# Patient Record
Sex: Female | Born: 1955 | Race: White | Hispanic: No | Marital: Married
Health system: Midwestern US, Community
[De-identification: ages and names within clinical notes are randomized; demographics above are authoritative.]

## PROBLEM LIST (undated history)

## (undated) DIAGNOSIS — S069XAA Unspecified intracranial injury with loss of consciousness status unknown, initial encounter: Secondary | ICD-10-CM

## (undated) DIAGNOSIS — F419 Anxiety disorder, unspecified: Secondary | ICD-10-CM

## (undated) HISTORY — PX: BACK SURGERY: SHX140

---

## 2016-03-24 ENCOUNTER — Emergency Department (HOSPITAL_COMMUNITY): Payer: 59

## 2016-03-24 ENCOUNTER — Emergency Department (HOSPITAL_COMMUNITY)
Admission: EM | Admit: 2016-03-24 | Discharge: 2016-03-24 | Disposition: A | Discharge status: Home / Self Care | Payer: 59 | Attending: Emergency Medicine | Admitting: Emergency Medicine

## 2016-03-24 ENCOUNTER — Encounter (HOSPITAL_COMMUNITY): Payer: Self-pay | Admitting: Emergency Medicine

## 2016-03-24 DIAGNOSIS — M79632 Pain in left forearm: Secondary | ICD-10-CM | POA: Diagnosis present

## 2016-03-24 DIAGNOSIS — S0990XA Unspecified injury of head, initial encounter: Secondary | ICD-10-CM | POA: Diagnosis not present

## 2016-03-24 DIAGNOSIS — T148XXA Other injury of unspecified body region, initial encounter: Secondary | ICD-10-CM

## 2016-03-24 DIAGNOSIS — Y929 Unspecified place or not applicable: Secondary | ICD-10-CM | POA: Diagnosis not present

## 2016-03-24 DIAGNOSIS — Y939 Activity, unspecified: Secondary | ICD-10-CM | POA: Diagnosis not present

## 2016-03-24 DIAGNOSIS — S93402A Sprain of unspecified ligament of left ankle, initial encounter: Secondary | ICD-10-CM | POA: Diagnosis not present

## 2016-03-24 DIAGNOSIS — Y999 Unspecified external cause status: Secondary | ICD-10-CM | POA: Diagnosis not present

## 2016-03-24 HISTORY — DX: Anxiety disorder, unspecified: F41.9

## 2016-03-24 MED ORDER — IBUPROFEN 200 MG PO TABS
600.0000 mg | ORAL_TABLET | Freq: Once | ORAL | Status: AC
  Administered 2016-03-24: 600 mg via ORAL
  Filled 2016-03-24: qty 3

## 2016-03-24 MED ORDER — IBUPROFEN 600 MG PO TABS: 600.0000 mg | ORAL_TABLET | Freq: Four times a day (QID) | ORAL | 0 refills | Status: AC | PRN

## 2016-03-24 MED ORDER — TRAMADOL HCL 50 MG PO TABS: 100.0000 mg | ORAL_TABLET | Freq: Four times a day (QID) | ORAL | 0 refills | Status: AC | PRN
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
## 2016-03-24 NOTE — ED Notes (Signed)
No respiratory or acute distress noted alert and oriented x 3 ice packs given warm blanket given call light in reach.

## 2016-03-24 NOTE — ED Notes (Signed)
No respiratory or acute distress noted alert and oriented x 3 wheelchair left with family no reaction to medication noted crutches with pt.
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
## 2018-05-05 ENCOUNTER — Encounter

## 2018-05-15 ENCOUNTER — Inpatient Hospital Stay: Admit: 2018-05-15 | Payer: PRIVATE HEALTH INSURANCE | Attending: Psychiatry | Primary: Family Medicine

## 2018-05-15 DIAGNOSIS — S069X9A Unspecified intracranial injury with loss of consciousness of unspecified duration, initial encounter: Secondary | ICD-10-CM

## 2018-05-16 ENCOUNTER — Inpatient Hospital Stay: Payer: Self-pay | Attending: Psychiatry | Primary: Family Medicine

## 2021-07-02 MED ORDER — GLUCAGON 1 MG INJECTION
1 mg | Freq: Once | INTRAMUSCULAR | Status: AC
Start: 2021-07-02 — End: 2021-07-10

## 2021-07-11 ENCOUNTER — Inpatient Hospital Stay: Payer: PRIVATE HEALTH INSURANCE | Primary: Family Medicine

## 2021-07-24 MED ORDER — GLUCAGON 1 MG INJECTION
1 mg | Freq: Once | INTRAMUSCULAR | Status: AC
Start: 2021-07-24 — End: 2021-08-02
  Administered 2021-08-02: 14:00:00 via INTRAMUSCULAR

## 2021-07-24 MED ORDER — ONDANSETRON (PF) 4 MG/2 ML INJECTION
4 mg/2 mL | Freq: Four times a day (QID) | INTRAMUSCULAR | Status: AC | PRN
Start: 2021-07-24 — End: 2021-08-02
  Administered 2021-08-02: 16:00:00 via INTRAVENOUS

## 2021-08-02 ENCOUNTER — Inpatient Hospital Stay: Admit: 2021-08-02 | Discharge: 2021-08-02 | Payer: BLUE CROSS/BLUE SHIELD | Primary: Family Medicine

## 2021-08-02 DIAGNOSIS — S062X0S Diffuse traumatic brain injury without loss of consciousness, sequela: Secondary | ICD-10-CM

## 2021-08-02 LAB — GLUCOSE, RANDOM
Glucose: 115 mg/dL — ABNORMAL HIGH (ref 65–100)
Glucose: 115 mg/dL — ABNORMAL HIGH (ref 65–100)
Glucose: 130 mg/dL — ABNORMAL HIGH (ref 65–100)
Glucose: 130 mg/dL — ABNORMAL HIGH (ref 65–100)
Glucose: 152 mg/dL — ABNORMAL HIGH (ref 65–100)
Glucose: 152 mg/dL — ABNORMAL HIGH (ref 65–100)
Glucose: 92 mg/dL (ref 65–100)
Glucose: 111 mg/dL — ABNORMAL HIGH (ref 65–100)
Glucose: 111 mg/dL — ABNORMAL HIGH (ref 65–100)
Glucose: 128 mg/dL — ABNORMAL HIGH (ref 65–100)
Glucose: 128 mg/dL — ABNORMAL HIGH (ref 65–100)
Glucose: 88 mg/dL (ref 65–100)
Glucose: 88 mg/dL (ref 65–100)
Glucose: 92 mg/dL (ref 65–100)

## 2021-08-02 MED FILL — ONDANSETRON (PF) 4 MG/2 ML INJECTION: 4 mg/2 mL | INTRAMUSCULAR | Qty: 4

## 2021-08-02 MED FILL — GLUCAGEN HYPOKIT 1 MG INJECTION: 1 mg | INTRAMUSCULAR | Qty: 1

## 2021-08-02 NOTE — Progress Notes (Signed)
[facility] Peds/Adult Note                       Date: August 02, 2021    Name: Sandra Bautista    MRN: [id number]         DOB: 1956/03/26    0840 Patient arrives for Glucagon stimulation test without acute problems. Please see [facility] for complete assessment and education provided.    Vital signs stable throughout and prior to discharge. Patient tolerated procedure well and was discharged without incident.  Patient is aware of no further [facility] appointments and to follow up with referring provider for any questions or concerns.        Sandra Bautista vitals were reviewed prior to and after treatment. Patient Vitals for the past 12 hrs:   Temp Pulse Resp BP   08/02/21 1205 -- 81 16 108/73   08/02/21 0840 98.1 F (36.7 C) 85 18 138/74       Lab results were obtained and reviewed.  See [facility] for pending lab results.  Recent Results (from the past 12 hour(s))   GLUCOSE, RANDOM    Collection Time: 08/02/21  8:48 AM   Result Value Ref Range    Glucose 115 (H) 65 - 100 mg/dL   GLUCOSE, RANDOM    Collection Time: 08/02/21  9:26 AM   Result Value Ref Range    Glucose 130 (H) 65 - 100 mg/dL   GLUCOSE, RANDOM    Collection Time: 08/02/21  9:55 AM   Result Value Ref Range    Glucose 152 (H) 65 - 100 mg/dL       Medications given:   Medications Administered       glucagon (GLUCAGEN) injection 1 mg       Admin Date  08/02/2021 Action  Given Dose  1 mg Route  IntraMUSCular Administered By  Laural Roes, RN              ondansetron [facility]) injection 8 mg       Admin Date  08/02/2021 Action  Given Dose  8 mg Route  IntraVENous Administered By  Laural Roes, RN               1120:  Patient vomiting despite Zofran given at 1050.      Sandra Bautista tolerated the infusion, and had no complaints. Patient was able to tolerate PO intake without any nausea or vomiting prior to discharge.    [facility]. Discharge Instructions provided to patient, patient verbalized  understanding but denied the request for a copy of d/c instructions.     No future appointments.    Laural Roes, RN  August 02, 2021  11:20 AM

## 2021-08-02 NOTE — Progress Notes (Signed)
Patient tolerated testing without complaint.

## 2021-08-03 LAB — GROWTH HORMONE
GROWTH HORMONE,GH: 0.1 ng/mL (ref 0.0–10.0)
GROWTH HORMONE,GH: 0.2 ng/mL (ref 0.0–10.0)
GROWTH HORMONE,GH: 0.3 ng/mL (ref 0.0–10.0)
GROWTH HORMONE,GH: 0.8 ng/mL (ref 0.0–10.0)
GROWTH HORMONE,GH: 1.4 ng/mL (ref 0.0–10.0)
GROWTH HORMONE,GH: <0.1 ng/mL (ref 0.0–10.0)
GROWTH HORMONE,GH: <0.1 ng/mL (ref 0.0–10.0)
Growth hormone: 0.2 ng/mL (ref 0.0–10.0)
Growth hormone: <0.1 ng/mL (ref 0.0–10.0)
Growth hormone: 0.1 ng/mL (ref 0.0–10.0)
Growth hormone: <0.1 ng/mL (ref 0.0–10.0)
Growth hormone: 0.3 ng/mL (ref 0.0–10.0)
Growth hormone: 0.8 ng/mL (ref 0.0–10.0)
Growth hormone: 1.4 ng/mL (ref 0.0–10.0)
